# Patient Record
Sex: Female | Born: 2005 | Race: Black or African American | Hispanic: No | Marital: Single | Smoking: Never smoker
Health system: Southern US, Community
[De-identification: ages and names within clinical notes are randomized; demographics above are authoritative.]

## PROBLEM LIST (undated history)

## (undated) DIAGNOSIS — G809 Cerebral palsy, unspecified: Secondary | ICD-10-CM

## (undated) DIAGNOSIS — J302 Other seasonal allergic rhinitis: Secondary | ICD-10-CM

## (undated) HISTORY — PX: EYE SURGERY: SHX253

---
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
## 2024-03-06 ENCOUNTER — Other Ambulatory Visit: Payer: Self-pay

## 2024-03-06 ENCOUNTER — Encounter (HOSPITAL_COMMUNITY): Payer: Self-pay

## 2024-03-06 ENCOUNTER — Emergency Department (HOSPITAL_COMMUNITY)
Admission: EM | Admit: 2024-03-06 | Discharge: 2024-03-06 | Disposition: A | Discharge status: Home / Self Care | Attending: Emergency Medicine | Admitting: Emergency Medicine

## 2024-03-06 ENCOUNTER — Emergency Department (HOSPITAL_COMMUNITY)

## 2024-03-06 DIAGNOSIS — R55 Syncope and collapse: Secondary | ICD-10-CM | POA: Insufficient documentation

## 2024-03-06 DIAGNOSIS — R7989 Other specified abnormal findings of blood chemistry: Secondary | ICD-10-CM | POA: Diagnosis not present

## 2024-03-06 LAB — CBC
HCT: 39.9 % (ref 36.0–46.0)
Hemoglobin: 13.1 g/dL (ref 12.0–15.0)
MCH: 30.5 pg (ref 26.0–34.0)
MCHC: 32.8 g/dL (ref 30.0–36.0)
MCV: 93 fL (ref 80.0–100.0)
Platelets: 205 K/uL (ref 150–400)
RBC: 4.29 MIL/uL (ref 3.87–5.11)
RDW: 13.6 % (ref 11.5–15.5)
WBC: 7.6 K/uL (ref 4.0–10.5)
nRBC: 0 % (ref 0.0–0.2)

## 2024-03-06 LAB — COMPREHENSIVE METABOLIC PANEL WITH GFR
ALT: 19 U/L (ref 0–44)
AST: 22 U/L (ref 15–41)
Albumin: 3.8 g/dL (ref 3.5–5.0)
Alkaline Phosphatase: 41 U/L (ref 38–126)
Anion gap: 10 (ref 5–15)
BUN: 13 mg/dL (ref 6–20)
CO2: 23 mmol/L (ref 22–32)
Calcium: 8.6 mg/dL — ABNORMAL LOW (ref 8.9–10.3)
Chloride: 102 mmol/L (ref 98–111)
Creatinine, Ser: 1.19 mg/dL — ABNORMAL HIGH (ref 0.44–1.00)
GFR, Estimated: >60 mL/min (ref >60)
Glucose, Bld: 101 mg/dL — ABNORMAL HIGH (ref 70–99)
Potassium: 3.5 mmol/L (ref 3.5–5.1)
Sodium: 135 mmol/L (ref 135–145)
Total Bilirubin: 1.2 mg/dL (ref 0.0–1.2)
Total Protein: 6.8 g/dL (ref 6.5–8.1)

## 2024-03-06 LAB — URINALYSIS, ROUTINE W REFLEX MICROSCOPIC
Bilirubin Urine: NEGATIVE
Glucose, UA: NEGATIVE mg/dL
Hgb urine dipstick: NEGATIVE
Ketones, ur: NEGATIVE mg/dL
Leukocytes,Ua: NEGATIVE
Nitrite: NEGATIVE
Protein, ur: NEGATIVE mg/dL
Specific Gravity, Urine: 1.015 (ref 1.005–1.030)
pH: 6 (ref 5.0–8.0)

## 2024-03-06 LAB — HCG, SERUM, QUALITATIVE: Preg, Serum: NEGATIVE

## 2024-03-06 LAB — CBG MONITORING, ED: Glucose-Capillary: 109 mg/dL — ABNORMAL HIGH (ref 70–99)

## 2024-03-06 NOTE — ED Notes (Signed)
 Pt made aware we need urine sample. Pt stated she is unable to urinate at this time. Pt has urine cup

## 2024-03-06 NOTE — Discharge Instructions (Signed)
 Thank you for visiting the Emergency Department today. It was a pleasure to be part of your healthcare team.  You were seen today for an episode of passing out and your test results showed no acute findings.  As discussed, you have been referred to see neurology for further workup regarding these episodes.  It is important to watch for warning signs such as recurrent episodes, if any of these happen, return to the Emergency Department or call 911. Thank you for trusting us  with your health.

## 2024-03-06 NOTE — ED Notes (Signed)
 [facility] called.

## 2024-03-06 NOTE — ED Triage Notes (Signed)
 Mother with patient states patient has near syncopal episode hx. CP

## 2024-03-06 NOTE — ED Notes (Signed)
 PA at [facility].

## 2024-03-06 NOTE — ED Triage Notes (Addendum)
 Pt woke up feeling out and had a syncopal episode witnessed by mother that lasted for 1 min. Mom states that when pt woke up she was out of it. Unknown if hit head. Hx of Cerebral Palsy.  No blood thinners. Axox4.

## 2024-03-06 NOTE — ED Provider Notes (Signed)
 [facility] Provider Note   CSN: [id number] Arrival date & time: 03/06/24  9295     Patient presents with: Loss of Consciousness   Alexa Kennedy is a 18 y.o. female with a history of cerebral palsy who presents to the ED for evaluation after syncopal episode that happened this morning upon waking.  Patient stated that she got out of bed, walked into the kitchen to get some water, and passed out.  Patient states that she believes that she may have hit her head on the floor.  Denies anticoagulation use.  Patient's mother states that she will occasionally have these episodes of passing out when she gets too hot or if she's about to start her menstrual cycle. Patients mother denies any seizure-like activity. Patient is not currently being followed by a PCP or neurologist per mother. Patient in no acute distress at this time.   Loss of Consciousness      Prior to Admission medications   Medication Sig Start Date End Date Taking? Authorizing Provider  cetirizine  (ZYRTEC ) 10 MG tablet Take 1 tablet (10 mg total) by mouth daily. Patient not taking: Reported on 10/24/2019 12/01/16   Danny Derril Garre, MD  fluticasone  (FLONASE ) 50 MCG/ACT nasal spray Place 1 spray into both nostrils daily. Patient not taking: Reported on 12/01/2016 09/03/16   Rainelle Sharlet RAMAN, MD  hydrocortisone  2.5 % ointment Apply topically 2 (two) times daily. Patient not taking: Reported on 10/24/2019 12/01/16   Danny Derril Garre, MD    Allergies: Patient has no known allergies.    Review of Systems  Cardiovascular:  Positive for syncope.    Updated Vital Signs BP 110/69   Pulse 85   Temp 98 F (36.7 C) (Oral)   Resp 20   Ht 5' 2 (1.575 m)   LMP 02/10/2024 (Exact Date)   SpO2 100%   Physical Exam Vitals and nursing note reviewed.  Constitutional:      General: She is not in acute distress.    Appearance: Normal appearance.  HENT:     Head:  Normocephalic and atraumatic. No abrasion, contusion or laceration.  Eyes:     Extraocular Movements: Extraocular movements intact.     Conjunctiva/sclera: Conjunctivae normal.     Pupils: Pupils are equal, round, and reactive to light.  Cardiovascular:     Rate and Rhythm: Normal rate and regular rhythm.     Pulses: Normal pulses.          Radial pulses are 2+ on the right side.  Pulmonary:     Effort: Pulmonary effort is normal. No respiratory distress.  Abdominal:     General: Abdomen is flat.     Palpations: Abdomen is soft.     Tenderness: There is no abdominal tenderness.  Musculoskeletal:        General: Normal range of motion.     Cervical back: Normal range of motion.  Skin:    General: Skin is warm and dry.     Capillary Refill: Capillary refill takes less than 2 seconds.  Neurological:     General: No focal deficit present.     Mental Status: She is alert. Mental status is at baseline.     Comments: Patient alert and oriented.  Speech clear and appropriate to her baseline. Cranial nerves III through XII intact: Motor strength is consistent with patients baseline of cerebral palsy without any new deficits.  Sensation intact to light touch in upper and lower extremities  bilaterally.  No focal neurologic deficits appreciated.  Psychiatric:        Mood and Affect: Mood normal.     (all labs ordered are listed, but only abnormal results are displayed) Labs Reviewed  COMPREHENSIVE METABOLIC PANEL WITH GFR - Abnormal; Notable for the following components:      Result Value   Glucose, Bld 101 (*)    Creatinine, Ser 1.19 (*)    Calcium 8.6 (*)    All other components within normal limits  CBG MONITORING, ED - Abnormal; Notable for the following components:   Glucose-Capillary 109 (*)    All other components within normal limits  CBC  URINALYSIS, ROUTINE W REFLEX MICROSCOPIC  HCG, SERUM, QUALITATIVE    EKG: EKG Interpretation Date/Time:  Monday March 06 2024  08:04:48 EST Ventricular Rate:  75 PR Interval:  164 QRS Duration:  91 QT Interval:  355 QTC Calculation: 397 R Axis:   70  Text Interpretation: Sinus rhythm No previous ECGs available Confirmed by Dreama Longs ([id number]) on 03/06/2024 11:01:57 AM  Radiology: CT Head Wo Contrast Result Date: 03/06/2024 EXAM: CT HEAD WITHOUT 03/06/2024 09:25:59 AM TECHNIQUE: CT of the head was performed without the administration of intravenous contrast. Automated exposure control, iterative reconstruction, and/or weight based adjustment of the mA/kV was utilized to reduce the radiation dose to as low as reasonably achievable. COMPARISON: CT of the head dated 10/18/2019. 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 No evidence of acute traumatic injury or adverse interval change. No acute intracranial hemorrhage. No mass effect or midline shift. No extra-axial fluid collection. No evidence of acute infarct. No hydrocephalus. ORBITS: No acute abnormality. SINUSES AND MASTOIDS: No acute abnormality. SOFT TISSUES AND SKULL: No acute skull fracture. No acute soft tissue abnormality. IMPRESSION: 1. No acute intracranial abnormality related to the head trauma. 2. 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 Electronically signed by: Evalene Coho MD 03/06/2024 10:03 AM EST RP Workstation: [id number]     Procedures   Medications Ordered in the ED - No data to display                                Medical Decision Making Amount and/or Complexity of Data Reviewed Labs: ordered.   Patient presents to the ED for: redcurrant syncopal episodes This involves an extensive number of treatment options and is a complaint that carries with it a high risk of  complications  Differential diagnosis includes:  Metabolic etiology Orthostatic/vasovagal Neurologic etiology Co-morbid conditions: Cerebral palsy  Additional history/records obtained and reviewed: Additional history obtained from  outside medical records External records from outside source obtained and reviewed prior CT imaging.   Clinical Course as of 03/06/24 2037  Mon Mar 06, 2024  0728 BP: 109/67 Vitals stable, patient afebrile, no acute distress. [ML]  0740 CBC WNL [ML]  0810 Comprehensive metabolic panel(!) Mildly elevated creatinine - no additional acute findings [ML]  0810 Orthostatics performed by RN staff unremarkable [ML]  1047 Urinalysis, Routine w reflex microscopic -Urine, Clean Catch WNL [ML]  1048 CT Head Wo Contrast No acute findings-chronic findings of parietal occipital encephalomalacia, stable  [ML]  1102 ED EKG Sinus rhythm [ML]    Clinical Course User Index [ML] Willma Duwaine CROME, PA  Data Reviewed / Actions Taken: Labs ordered/reviewed with my independent interpretation in ED course above. Imaging ordered/reviewed with my independent interpretation in ED course above. I agree with the radiologists interpretation.  EKG ordered/reviewed with my independent interpretation in ED course above. The patient was kept on continuous cardiac monitoring during the ED stay.  ED Course / Reassessments: Problem List: syncopal episode 18 year old female presented for evaluation after a syncopal episode. Initial assessment included history, physical exam, and review of prior medical records. Physical exam revealed no acute findings, with a neurologic exam consistent with patients baseline/history of cerebral palsy. Laboratory studies, imaging, and other ancillary studies were obtained, key results included a slightly elevated creatine and stable, chronic findings on CT consistent with patients history. Vital signs were obtained and monitored, and the patient remained  stable throughout the stay - patient had no change in mental status during visit. EKG was WNL. Orthostatics were unremarkable.  Given patient's medical history of cerebral palsy, complaint of syncopal episode, unremarkable physical exam findings, nonacute laboratory findings, chronic/stable findings on CT, and no change in mental status during visit, plan to discharge patient with close follow-up with neurology for continued monitoring and strict return precautions if syncopal episode occurs again.  Patient response: unchanged Serial reassessments performed: Yes  Social determinants impacting care: physical disability/mobility limitations   Disposition: Disposition: Discharge with close follow-up with new established PCP and referral sent to neurology. Patient given strict return precautions if symptoms persist.    Rationale for disposition: stable for discharge. The disposition plan and rationale were discussed with the patient and her mother at the bedside, all questions were addressed, and the patient demonstrated understanding.  This note was produced using [employer]. While I have reviewed and verified all clinical information, transcription errors may remain.      Final diagnoses:  Syncope, unspecified syncope type    ED Discharge Orders          Ordered    Ambulatory referral to Neurology       Comments: An appointment is requested in approximately: 1 week   03/06/24 1111               Willma Duwaine CROME, [place] 03/06/24 2053    Dreama Longs, MD 03/07/24 [phone]

## 2024-03-06 NOTE — ED Notes (Signed)
CBG 109 

## 2024-03-27 ENCOUNTER — Encounter: Payer: Self-pay | Admitting: Neurology

## 2024-05-25 ENCOUNTER — Ambulatory Visit: Payer: Self-pay | Admitting: Neurology
